# Patient Record
Sex: Male | Born: 1990
Health system: Southern US, Community
[De-identification: ages and names within clinical notes are randomized; demographics above are authoritative.]

---

## 2018-09-19 ENCOUNTER — Encounter: Payer: Self-pay | Admitting: Family Medicine

## 2018-09-19 ENCOUNTER — Ambulatory Visit: Payer: BLUE CROSS/BLUE SHIELD | Admitting: Family Medicine

## 2018-09-19 VITALS — BP 92/60 | HR 72 | Temp 97.9°F | Ht 69.5 in | Wt 153.4 lb

## 2018-09-19 DIAGNOSIS — M25511 Pain in right shoulder: Secondary | ICD-10-CM

## 2018-09-19 DIAGNOSIS — J301 Allergic rhinitis due to pollen: Secondary | ICD-10-CM

## 2018-09-19 DIAGNOSIS — Z0000 Encounter for general adult medical examination without abnormal findings: Secondary | ICD-10-CM | POA: Diagnosis not present

## 2018-09-19 DIAGNOSIS — Z114 Encounter for screening for human immunodeficiency virus [HIV]: Secondary | ICD-10-CM | POA: Diagnosis not present

## 2018-09-19 DIAGNOSIS — M25519 Pain in unspecified shoulder: Secondary | ICD-10-CM | POA: Insufficient documentation

## 2018-09-19 DIAGNOSIS — G8929 Other chronic pain: Secondary | ICD-10-CM

## 2018-09-19 DIAGNOSIS — J309 Allergic rhinitis, unspecified: Secondary | ICD-10-CM | POA: Insufficient documentation

## 2018-09-19 LAB — COMPREHENSIVE METABOLIC PANEL
ALT: 9 U/L (ref 0–53)
AST: 11 U/L (ref 0–37)
Albumin: 4.5 g/dL (ref 3.5–5.2)
Alkaline Phosphatase: 35 U/L — ABNORMAL LOW (ref 39–117)
BUN: 18 mg/dL (ref 6–23)
CHLORIDE: 105 meq/L (ref 96–112)
CO2: 30 mEq/L (ref 19–32)
Calcium: 9.2 mg/dL (ref 8.4–10.5)
Creatinine, Ser: 0.77 mg/dL (ref 0.40–1.50)
GFR: 126.29 mL/min (ref >60.00)
GLUCOSE: 94 mg/dL (ref 70–99)
POTASSIUM: 4.1 meq/L (ref 3.5–5.1)
SODIUM: 140 meq/L (ref 135–145)
Total Bilirubin: 0.4 mg/dL (ref 0.2–1.2)
Total Protein: 6.8 g/dL (ref 6.0–8.3)

## 2018-09-19 LAB — CBC
HEMATOCRIT: 40.4 % (ref 39.0–52.0)
HEMOGLOBIN: 13.1 g/dL (ref 13.0–17.0)
MCHC: 32.3 g/dL (ref 30.0–36.0)
MCV: 81.7 fl (ref 78.0–100.0)
Platelets: 219 10*3/uL (ref 150.0–400.0)
RBC: 4.95 Mil/uL (ref 4.22–5.81)
RDW: 13.9 % (ref 11.5–15.5)
WBC: 5.7 10*3/uL (ref 4.0–10.5)

## 2018-09-19 LAB — TSH: TSH: 2.97 u[IU]/mL (ref 0.35–4.50)

## 2018-09-19 MED ORDER — MELOXICAM 7.5 MG PO TABS: 7.5000 mg | ORAL_TABLET | Freq: Every day | ORAL | 1 refills | Status: AC

## 2018-09-19 NOTE — Progress Notes (Signed)
James Bruce - 27 y.o. male MRN [id number]  Date of birth: 07-11-1991  Subjective Chief Complaint  Patient presents with  . Annual Exam    HPI James Bruce is a 27 y.o. male here today to establish care and for annual exam.  He has a couple of additional concerns as well.  He follows a fairly health diet with regular exercise.  Unfortunately he does smoke about 1ppd.  He started smoking when he was about 27 years old.  Has tried to quit in the past but was unsuccessful.  Has never tried any aids or medications to help with smoking cessation.  He feels he is not ready to try to quit at this time.   -Shoulder pain:  R shoulder pain off and on for the past few months.  Denies any known injury.  Pain worse with pushing/pulling movement.  Some mild pain in mid to lower back as well.  Denies numbness, tingling, weakess.  Has not tried anything for treatment.   -Congestion:  Congestion that occurs typically around the fall months.  He has clear nasal discharge w/ sneezing.  Denies sinus pain/pressure, fever, chills, ear pain.   Review of Systems  Constitutional: Negative for chills, fever, malaise/fatigue and weight loss.  HENT: Positive for congestion. Negative for ear pain and sore throat.   Eyes: Negative for blurred vision, double vision and pain.  Respiratory: Negative for cough and shortness of breath.   Cardiovascular: Negative for chest pain and palpitations.  Gastrointestinal: Negative for abdominal pain, blood in stool, constipation, heartburn and nausea.  Genitourinary: Negative for dysuria and urgency.  Musculoskeletal: Positive for joint pain. Negative for myalgias.  Neurological: Negative for dizziness and headaches.  Endo/Heme/Allergies: Does not bruise/bleed easily.  Psychiatric/Behavioral: Negative for depression. The patient is not nervous/anxious and does not have insomnia.      No Known Allergies  History reviewed. No pertinent past medical history.  History reviewed. No  pertinent surgical history.  Social History   Socioeconomic History  . Marital status: Single    Spouse name: Not on file  . Number of children: Not on file  . Years of education: Not on file  . Highest education level: Not on file  Occupational History  . Not on file  Social Needs  . Financial resource strain: Not on file  . Food insecurity:    Worry: Not on file    Inability: Not on file  . Transportation needs:    Medical: Not on file    Non-medical: Not on file  Tobacco Use  . Smoking status: Current Every Day Smoker  . Smokeless tobacco: Never Used  Substance and Sexual Activity  . Alcohol use: Yes    Comment: rarely   . Drug use: Never  . Sexual activity: Not on file  Lifestyle  . Physical activity:    Days per week: Not on file    Minutes per session: Not on file  . Stress: Not on file  Relationships  . Social connections:    Talks on phone: Not on file    Gets together: Not on file    Attends religious service: Not on file    Active member of club or organization: Not on file    Attends meetings of clubs or organizations: Not on file    Relationship status: Not on file  Other Topics Concern  . Not on file  Social History Narrative  . Not on file    History reviewed. No pertinent family  history.  Health Maintenance  Topic Date Due  . HIV Screening  12/18/2003  . TETANUS/TDAP  12/18/2007  . INFLUENZA VACCINE  09/16/2019 (Originally 07/10/2018)    ----------------------------------------------------------------------------------------------------------------------------------------------------------------------------------------------------------------- Physical Exam BP 92/60   Pulse 72   Temp 97.9 F (36.6 C) (Oral)   Ht 5' 9.5" (1.765 m)   Wt 153 lb 5.8 oz (69.6 kg)   SpO2 99%   BMI 22.32 kg/m   Physical Exam  Constitutional: He is oriented to person, place, and time. He appears well-nourished. No distress.  HENT:  Head: Normocephalic and  atraumatic.  Right Ear: External ear normal.  Left Ear: External ear normal.  Mouth/Throat: Oropharynx is clear and moist.  Eyes: No scleral icterus.  Neck: Normal range of motion. No thyromegaly present.  Cardiovascular: Normal rate, regular rhythm, normal heart sounds and intact distal pulses.  Pulmonary/Chest: Effort normal and breath sounds normal.  Abdominal: Soft. Bowel sounds are normal. He exhibits no distension. There is no tenderness. There is no guarding.  Musculoskeletal: He exhibits no edema.  Lymphadenopathy:    He has no cervical adenopathy.  Neurological: He is alert and oriented to person, place, and time. No cranial nerve deficit. He exhibits normal muscle tone.  Skin: Skin is warm and dry. No rash noted.  Psychiatric: He has a normal mood and affect. His behavior is normal.    ------------------------------------------------------------------------------------------------------------------------------------------------------------------------------------------------------------------- Assessment and Plan  Shoulder pain -Intermittent pain, no abnormalities on exam today.   -Meloxicam as needed, he will let me know if not improving with this.   Allergic rhinitis -Recommend trial of cetirizine.   Well adult exam -Well adult Orders Placed This Encounter  Procedures  . Comp Met (CMET)  . CBC  . TSH  . HIV antibody (with reflex)  Immunizations:  Declines flu vaccine Screenings:  HIV screening Anticipatory guidance/risk factor reduction: Counseled on smoking cessation.  Additional recommendations per AVS.

## 2018-09-19 NOTE — Patient Instructions (Signed)
-Try cetirizine for nasal congestion -You can add on flonase if not improving with this.    Adult Wellness Guidelines   Adult Health - for Ages 31 and Over Preventive care is very important for adults. By making some good basic health choices, women and men can boost their own health and well-being. Some of these positive choices include:   Eat a healthy diet  Get regular exercise  Don't use tobacco products  Limit alcohol use  Strive for a healthy weight  Adult Recommendations Screenings Physical Exam Every year, or as directed by your doctor. Body Mass Index (BMI) Every year. Blood Pressure (BP) At least every two years. Colon Cancer Screening Beginning at age 71 - colonoscopy every 10 years, or flexible sigmoidoscopy every five years or fecal blood test annually. Diabetes Screening Those with high blood pressure or high cholesterol should be screened. Others, especially those who are overweight or have a close family history of diabetes, should consider being screened every three years. Vision Screening Every year.  Immunizations Tetanus, Diphtheria, Pertussis (Td/ Tdap) Get Tdap vaccine once, then a Td booster every 10 years. Influenza (Flu) Every year. Herpes Zoster (Shingles) One dose given at age 53 and over. Varicella (Chicken Pox) Two doses if no evidence of immunity. Pneumococcal (Pneumonia) One or two doses for adults age 68 and older, or one or two doses depending on indication. Measles, Mumps, Rubella (MMR) One or two doses for adults ages 90-55 if no evidence of immunity. Human Papillomavirus (HPV) Three doses for women ages 19-26 if not already given. Three doses for men ages 19-21 if not already given.* Hepatitis A Two or three doses for adults age 54 and over.** Hepatitis B Three doses for ages 54 and over.** * Recommendations may vary. Discuss the start and frequency of screenings with your doctor, especially if you are at increased risk. ** For select  populations. Discuss with your doctor if this vaccine is right for you.  Women's Health Women have their own unique health care needs. To stay well, they should make regular screenings a priority. Women should discuss the recommendations listed on the chart with their doctors. Women's Recommendations Mammogram Every year for women beginning at age 60.* Cholesterol Starting age and frequency of screenings are based on your individual risk factors. Talk with your doctor about what is best for you. Pap Test Women ages 21-65: Pap test every three years. Another option for ages 33-65: Pap test and HPV test every five years. Women who have had a hysterectomy or are over age 70 may not need a Pap test.* Osteoporosis Screening Beginning at age 26, or at age 38 if risk factors are present.* Aspirin Use At ages 55-79, talk with your doctor about the benefits and risks of aspirin use. Pelvic Exam Every year for ages 62 and over. Folic Acid Women planning/capable of pregnancy should take a daily supplement containing .4-.8 mg of folic acid for prevention of neural tube defects.  * Recommendations may vary. Discuss screening options with your doctor, especially if you are at increased risk. Sources: [place] Department of Health and Financial controller and the Centers for Disease Control and Prevention, U.S. Preventive Services Task Force   Men's Health Recommendations Men are encouraged to get care as needed and make smart choices. That includes following a healthy lifestyle and getting recommended preventive care services.  Recommended preventative care services are as follows:  Cholesterol Ages 20-35 should be tested if at high risk. Men age  35 and over should be tested. Prostate Cancer Screening Ages 34 and over, discuss the benefits and risks of screening with your doctor.* Abdominal Aortic Aneurysm Once between ages 67 and 62 if you have ever smoked.

## 2018-09-19 NOTE — Assessment & Plan Note (Signed)
-  Recommend trial of cetirizine.

## 2018-09-19 NOTE — Progress Notes (Addendum)
James Bruce - 27 y.o. male MRN [id number]  Date of birth: 1991-08-26  Subjective Chief Complaint  Patient presents with  . Annual Exam    HPI 27 y/o patient that presents today to establish care. He denies any significant past medical history. He reports getting dental care within the last 6 months but denies regularly receiving dental care. Additionally, patient denies regular eye exams but reports no changes in vision.   Patient presents with runny nose, sore throat, and cough. He denies fever, chills, or body aches. He reports these symptoms to appear around Fall each year with minimal relief from OTC cold and flu meds.   Additionally, patient reports intermittent pain to Right shoulder that began approximately 9 months ago. He cannot remember it occurring with trauma or doing movement. He reports waking up one morning with pain in his shoulder. He reports irritation with repetitive movement at work. He report heat to have minimal relief. He denies pain at this time.   No Known Allergies  History reviewed. No pertinent past medical history.  History reviewed. No pertinent surgical history.  Social History   Socioeconomic History  . Marital status: Single    Spouse name: Not on file  . Number of children: Not on file  . Years of education: Not on file  . Highest education level: Not on file  Occupational History  . Not on file  Social Needs  . Financial resource strain: Not on file  . Food insecurity:    Worry: Not on file    Inability: Not on file  . Transportation needs:    Medical: Not on file    Non-medical: Not on file  Tobacco Use  . Smoking status: Current Every Day Smoker  . Smokeless tobacco: Never Used  Substance and Sexual Activity  . Alcohol use: Yes    Comment: rarely   . Drug use: Never  . Sexual activity: Not on file  Lifestyle  . Physical activity:    Days per week: Not on file    Minutes per session: Not on file  . Stress: Not on file    Relationships  . Social connections:    Talks on phone: Not on file    Gets together: Not on file    Attends religious service: Not on file    Active member of club or organization: Not on file    Attends meetings of clubs or organizations: Not on file    Relationship status: Not on file  Other Topics Concern  . Not on file  Social History Narrative  . Not on file   Psychosocial History: -Occupation: Production designer, theatre/television/film at [employer]: Lives at home with brother and brother's wife -Drug Use: Patient denies use of IV/Street/recreational drug use.  -Alcohol Use: Patient drinks one alcoholic beverage once a month -Caffeine: 1 Cup of coffee per day -Diet: Patient reports healthy diet -Exercise: Patient denies regular exercise    History reviewed. No pertinent family history.  Health Maintenance  Topic Date Due  . HIV Screening  12/18/2003  . TETANUS/TDAP  12/18/2007  . INFLUENZA VACCINE  09/16/2019 (Originally 07/10/2018)   ROS General: Patient reports adequate energy level with a regular sleeping pattern. Pt denies weight loss or weight gain.  HEENT: Denies vision or hearing changes. Pt denies difficulty swallowing.  Cardiac:  Denies chest pain, pressure, or palpitations. Pt denies syncope or dizziness.  Pulmonary: Denies shortness of breath GI: Patient denies n/v/d. Reports regular bowel movements.  GU: Pt reports  clear yellow urine.  Musculoskeletal: see HPI   ----------------------------------------------------------------------------------------------------------------------------------------------------------------------------------------------------------------- Physical Exam BP 92/60   Pulse 72   Temp 97.9 F (36.6 C) (Oral)   Ht 5' 9.5" (1.765 m)   Wt 153 lb 5.8 oz (69.6 kg)   SpO2 99%   BMI 22.32 kg/m   Physical Exam  Constitutional: He is oriented to person, place, and time. He appears well-developed and well-nourished.  HENT:  Right Ear: Tympanic  membrane is retracted. Tympanic membrane is not erythematous and not bulging.  Left Ear: Tympanic membrane is retracted. Tympanic membrane is not erythematous and not bulging.  Mouth/Throat: Oropharynx is clear and moist. No oropharyngeal exudate.  Eyes: No scleral icterus.  Neck: No thyromegaly present.  Cardiovascular: Normal rate, regular rhythm and normal heart sounds.  Pulmonary/Chest: Effort normal and breath sounds normal. No respiratory distress.  Abdominal: Soft. Bowel sounds are normal. He exhibits no distension.  Musculoskeletal:  FROM to R shoulder with a mildly positive scarf sign. Adequate rotator cuff strength. No signs of impingement. Negative empty can test.   Lymphadenopathy:    He has no cervical adenopathy.  Neurological: He is alert and oriented to person, place, and time.  Skin: Skin is warm and dry. Capillary refill takes less than 2 seconds.  Psychiatric: He has a normal mood and affect.    ------------------------------------------------------------------------------------------------------------------------------------------------------------------------------------------------------------------- Assessment and Plan  Wellness exam -Continue healthy diet and increase physical activity -Recommend receiving dental care q 6 months and eye exams annually.  -Labs: TSH, CBC, Chem 7, and HIV screening.  -Refuses influenza vaccine at this time.   Seasonal allergies -Begin taking zyrtec OTC daily. Can add flonase if no improvement.   Tendonitis -Take meloxicam as needed for pain to right shoulder

## 2018-09-19 NOTE — Assessment & Plan Note (Signed)
-  Well adult Orders Placed This Encounter  Procedures  . Comp Met (CMET)  . CBC  . TSH  . HIV antibody (with reflex)  Immunizations:  Declines flu vaccine Screenings:  HIV screening Anticipatory guidance/risk factor reduction: Counseled on smoking cessation.  Additional recommendations per AVS.

## 2018-09-19 NOTE — Assessment & Plan Note (Signed)
-  Intermittent pain, no abnormalities on exam today.   -Meloxicam as needed, he will let me know if not improving with this.

## 2018-09-20 LAB — HIV ANTIBODY (ROUTINE TESTING W REFLEX): HIV: NONREACTIVE

## 2019-05-20 ENCOUNTER — Encounter (HOSPITAL_COMMUNITY): Payer: Self-pay

## 2019-05-20 ENCOUNTER — Ambulatory Visit: Payer: Self-pay | Admitting: Family Medicine

## 2019-05-20 ENCOUNTER — Emergency Department (HOSPITAL_COMMUNITY)
Admission: EM | Admit: 2019-05-20 | Discharge: 2019-05-21 | Disposition: A | Discharge status: Home / Self Care | Payer: BC Managed Care – PPO | Attending: Emergency Medicine | Admitting: Emergency Medicine

## 2019-05-20 ENCOUNTER — Emergency Department (HOSPITAL_COMMUNITY): Payer: BC Managed Care – PPO

## 2019-05-20 ENCOUNTER — Other Ambulatory Visit: Payer: Self-pay

## 2019-05-20 DIAGNOSIS — F17200 Nicotine dependence, unspecified, uncomplicated: Secondary | ICD-10-CM | POA: Diagnosis not present

## 2019-05-20 DIAGNOSIS — R0602 Shortness of breath: Secondary | ICD-10-CM | POA: Diagnosis not present

## 2019-05-20 DIAGNOSIS — R0600 Dyspnea, unspecified: Secondary | ICD-10-CM | POA: Diagnosis not present

## 2019-05-20 DIAGNOSIS — R079 Chest pain, unspecified: Secondary | ICD-10-CM | POA: Diagnosis not present

## 2019-05-20 DIAGNOSIS — E875 Hyperkalemia: Secondary | ICD-10-CM | POA: Diagnosis not present

## 2019-05-20 DIAGNOSIS — J069 Acute upper respiratory infection, unspecified: Secondary | ICD-10-CM | POA: Diagnosis not present

## 2019-05-20 DIAGNOSIS — J019 Acute sinusitis, unspecified: Secondary | ICD-10-CM | POA: Insufficient documentation

## 2019-05-20 DIAGNOSIS — R0789 Other chest pain: Secondary | ICD-10-CM | POA: Diagnosis not present

## 2019-05-20 LAB — BASIC METABOLIC PANEL WITH GFR
Anion gap: 9 (ref 5–15)
BUN: 16 mg/dL (ref 6–20)
CO2: 23 mmol/L (ref 22–32)
Calcium: 9.2 mg/dL (ref 8.9–10.3)
Chloride: 105 mmol/L (ref 98–111)
Creatinine, Ser: 0.74 mg/dL (ref 0.61–1.24)
GFR calc Af Amer: >60 mL/min
GFR calc non Af Amer: >60 mL/min
Glucose, Bld: 79 mg/dL (ref 70–99)
Potassium: 3.8 mmol/L (ref 3.5–5.1)
Sodium: 137 mmol/L (ref 135–145)

## 2019-05-20 LAB — CBC
HCT: 42.2 % (ref 39.0–52.0)
Hemoglobin: 13.6 g/dL (ref 13.0–17.0)
MCH: 27 pg (ref 26.0–34.0)
MCHC: 32.2 g/dL (ref 30.0–36.0)
MCV: 83.7 fL (ref 80.0–100.0)
Platelets: 220 10*3/uL (ref 150–400)
RBC: 5.04 MIL/uL (ref 4.22–5.81)
RDW: 14.1 % (ref 11.5–15.5)
WBC: 6.1 10*3/uL (ref 4.0–10.5)
nRBC: 0 % (ref 0.0–0.2)

## 2019-05-20 LAB — TROPONIN I: Troponin I: <0.03 ng/mL

## 2019-05-20 MED ORDER — SODIUM CHLORIDE 0.9% FLUSH: 3.0000 mL | Freq: Once | INTRAVENOUS | Status: DC

## 2019-05-20 MED ORDER — DOXYCYCLINE HYCLATE 100 MG PO CAPS: 100.0000 mg | ORAL_CAPSULE | Freq: Two times a day (BID) | ORAL | 0 refills | Status: AC

## 2019-05-20 NOTE — Telephone Encounter (Signed)
Pt. Reports he started having shortness of breat today. "I can't get a good breath." Also has nasal congestion. Denies fever. States he has felt like this all day, sudden onset. Does have chest tightness. Instructed to go to UC today due to no availability in the office. Verbalizes understanding.  Reason for Disposition . [1] MILD difficulty breathing (e.g., minimal/no SOB at rest, SOB with walking, pulse <100) AND [2] NEW-onset or WORSE than normal  Answer Assessment - Initial Assessment Questions 1. RESPIRATORY STATUS: "Describe your breathing?" (e.g., wheezing, shortness of breath, unable to speak, severe coughing)      Shortness of breath with exertion 2. ONSET: "When did this breathing problem begin?"      Today 3. PATTERN "Does the difficult breathing come and go, or has it been constant since it started?"      Constant at work 4. SEVERITY: "How bad is your breathing?" (e.g., mild, moderate, severe)    - MILD: No SOB at rest, mild SOB with walking, speaks normally in sentences, can lay down, no retractions, pulse < 100.    - MODERATE: SOB at rest, SOB with minimal exertion and prefers to sit, cannot lie down flat, speaks in phrases, mild retractions, audible wheezing, pulse 100-120.    - SEVERE: Very SOB at rest, speaks in single words, struggling to breathe, sitting hunched forward, retractions, pulse > 120      Mild 5. RECURRENT SYMPTOM: "Have you had difficulty breathing before?" If so, ask: "When was the last time?" and "What happened that time?"      No 6. CARDIAC HISTORY: "Do you have any history of heart disease?" (e.g., heart attack, angina, bypass surgery, angioplasty)      No 7. LUNG HISTORY: "Do you have any history of lung disease?"  (e.g., pulmonary embolus, asthma, emphysema)     No 8. CAUSE: "What do you think is causing the breathing problem?"      Unsure 9. OTHER SYMPTOMS: "Do you have any other symptoms? (e.g., dizziness, runny nose, cough, chest pain, fever)  Chest feel a little tight 10. PREGNANCY: "Is there any chance you are pregnant?" "When was your last menstrual period?"       n/a 11. TRAVEL: "Have you traveled out of the country in the last month?" (e.g., travel history, exposures)       No  Protocols used: BREATHING DIFFICULTY-A-AH

## 2019-05-20 NOTE — Discharge Instructions (Addendum)
Doxycycline as prescribed.  Over-the-counter decongestants as needed.  Return to the ED if symptoms significantly worsen or change.

## 2019-05-20 NOTE — Telephone Encounter (Signed)
FYI

## 2019-05-20 NOTE — ED Triage Notes (Signed)
Pt sent here from UC for further evaluation of intermittent chest tightness, cough and SOB. Pt a.o, nad noted

## 2019-05-20 NOTE — ED Provider Notes (Signed)
[facility] Note   CSN: [id number] Arrival date & time: 05/20/19  1746    History   Chief Complaint Chief Complaint  Patient presents with  . Chest Pain  . Shortness of Breath    HPI Devoria Albendrew S Janowicz is a 28 y.o. male.     Patient is a 28 year old male presenting with complaints of difficulty breathing.  This has been occurring for the past four days.  He reports nasal congestion.  No chest pain, fever, or cough.  He went to urgent care, then was sent here due to concerns of his potassium level as the physician there thought his T-waves were peaked.    The history is provided by the patient.  Shortness of Breath  Severity:  Moderate Onset quality:  Gradual Duration:  4 days Timing:  Constant Progression:  Worsening Chronicity:  New Relieved by:  Nothing Worsened by:  Nothing Ineffective treatments:  None tried Associated symptoms: no cough     History reviewed. No pertinent past medical history.  Patient Active Problem List   Diagnosis Date Noted  . Well adult exam 09/19/2018  . Shoulder pain 09/19/2018  . Allergic rhinitis 09/19/2018    History reviewed. No pertinent surgical history.      Home Medications    Prior to Admission medications   Medication Sig Start Date End Date Taking? Authorizing Provider  meloxicam (MOBIC) 7.5 MG tablet Take 1-2 tablets (7.5-15 mg total) by mouth daily. 09/19/18   Everrett CoombeMatthews, [place], DO    Family History No family history on file.  Social History Social History   Tobacco Use  . Smoking status: Current Every Day Smoker  . Smokeless tobacco: Never Used  Substance Use Topics  . Alcohol use: Yes    Comment: rarely   . Drug use: Never     Allergies   Patient has no known allergies.   Review of Systems Review of Systems  Respiratory: Positive for shortness of breath. Negative for cough.   All other systems reviewed and are negative.    Physical Exam Updated Vital  Signs BP 124/69 (BP Location: Right Arm)   Pulse 68   Temp 98.3 F (36.8 C) (Oral)   Resp 18   SpO2 100%   Physical Exam Vitals signs and nursing note reviewed.  Constitutional:      General: He is not in acute distress.    Appearance: He is well-developed. He is not diaphoretic.  HENT:     Head: Normocephalic and atraumatic.  Neck:     Musculoskeletal: Normal range of motion and neck supple.  Cardiovascular:     Rate and Rhythm: Normal rate and regular rhythm.     Heart sounds: No murmur. No friction rub.  Pulmonary:     Effort: Pulmonary effort is normal. No respiratory distress.     Breath sounds: Normal breath sounds. No wheezing or rales.  Abdominal:     General: Bowel sounds are normal. There is no distension.     Palpations: Abdomen is soft.     Tenderness: There is no abdominal tenderness.  Musculoskeletal: Normal range of motion.  Lymphadenopathy:     Cervical: No cervical adenopathy.  Skin:    General: Skin is warm and dry.  Neurological:     Mental Status: He is alert and oriented to person, place, and time.     Coordination: Coordination normal.      ED Treatments / Results  Labs (all labs ordered  are listed, but only abnormal results are displayed) Labs Reviewed  BASIC METABOLIC PANEL  CBC  TROPONIN I    EKG EKG Interpretation  Date/Time:  Wednesday May 20 2019 17:56:53 EDT Ventricular Rate:  87 PR Interval:  140 QRS Duration: 94 QT Interval:  350 QTC Calculation: 421 R Axis:   79 Text Interpretation:  Normal sinus rhythm Minimal voltage criteria for LVH, may be normal variant Borderline ECG Confirmed by Veryl Speak [phone]) on 05/20/2019 11:21:20 PM   Radiology Dg Chest 2 View  Result Date: 05/20/2019 CLINICAL DATA:  Intermittent chest tightness. EXAM: CHEST - 2 VIEW COMPARISON:  None. FINDINGS: The heart size and mediastinal contours are within normal limits. Both lungs are clear. The visualized skeletal structures are unremarkable.  IMPRESSION: No active cardiopulmonary disease. Electronically Signed   By: Dorise Bullion III M.D   On: 05/20/2019 18:28    Procedures Procedures (including critical care time)  Medications Ordered in ED Medications  sodium chloride flush (NS) 0.9 % injection 3 mL (has no administration in time range)     Initial Impression / Assessment and Plan / ED Course  I have reviewed the triage vital signs and the nursing notes.  Pertinent labs & imaging results that were available during my care of the patient were reviewed by me and considered in my medical decision making (see chart for details).  Patient with complaints as described in the HPI.  I suspect sinus issues as this is what he is describing.  EKG is normal, troponin negative, and potassium level normal.  He will discharged with doxycycline for presumed sinusitis and decongestants.  To return prn.   Final Clinical Impressions(s) / ED Diagnoses   Final diagnoses:  None    ED Discharge Orders    None       Veryl Speak, MD 05/20/19 2357

## 2019-05-20 NOTE — ED Notes (Signed)
Patient called for vitals reassessment, no answer.  

## 2020-02-13 IMAGING — CR CHEST - 2 VIEW
2 series · 2 of 2 positions shown · non-contrast
Comparison: None.

CLINICAL DATA: Intermittent chest tightness.

EXAM:
CHEST - 2 VIEW

[chest pa]
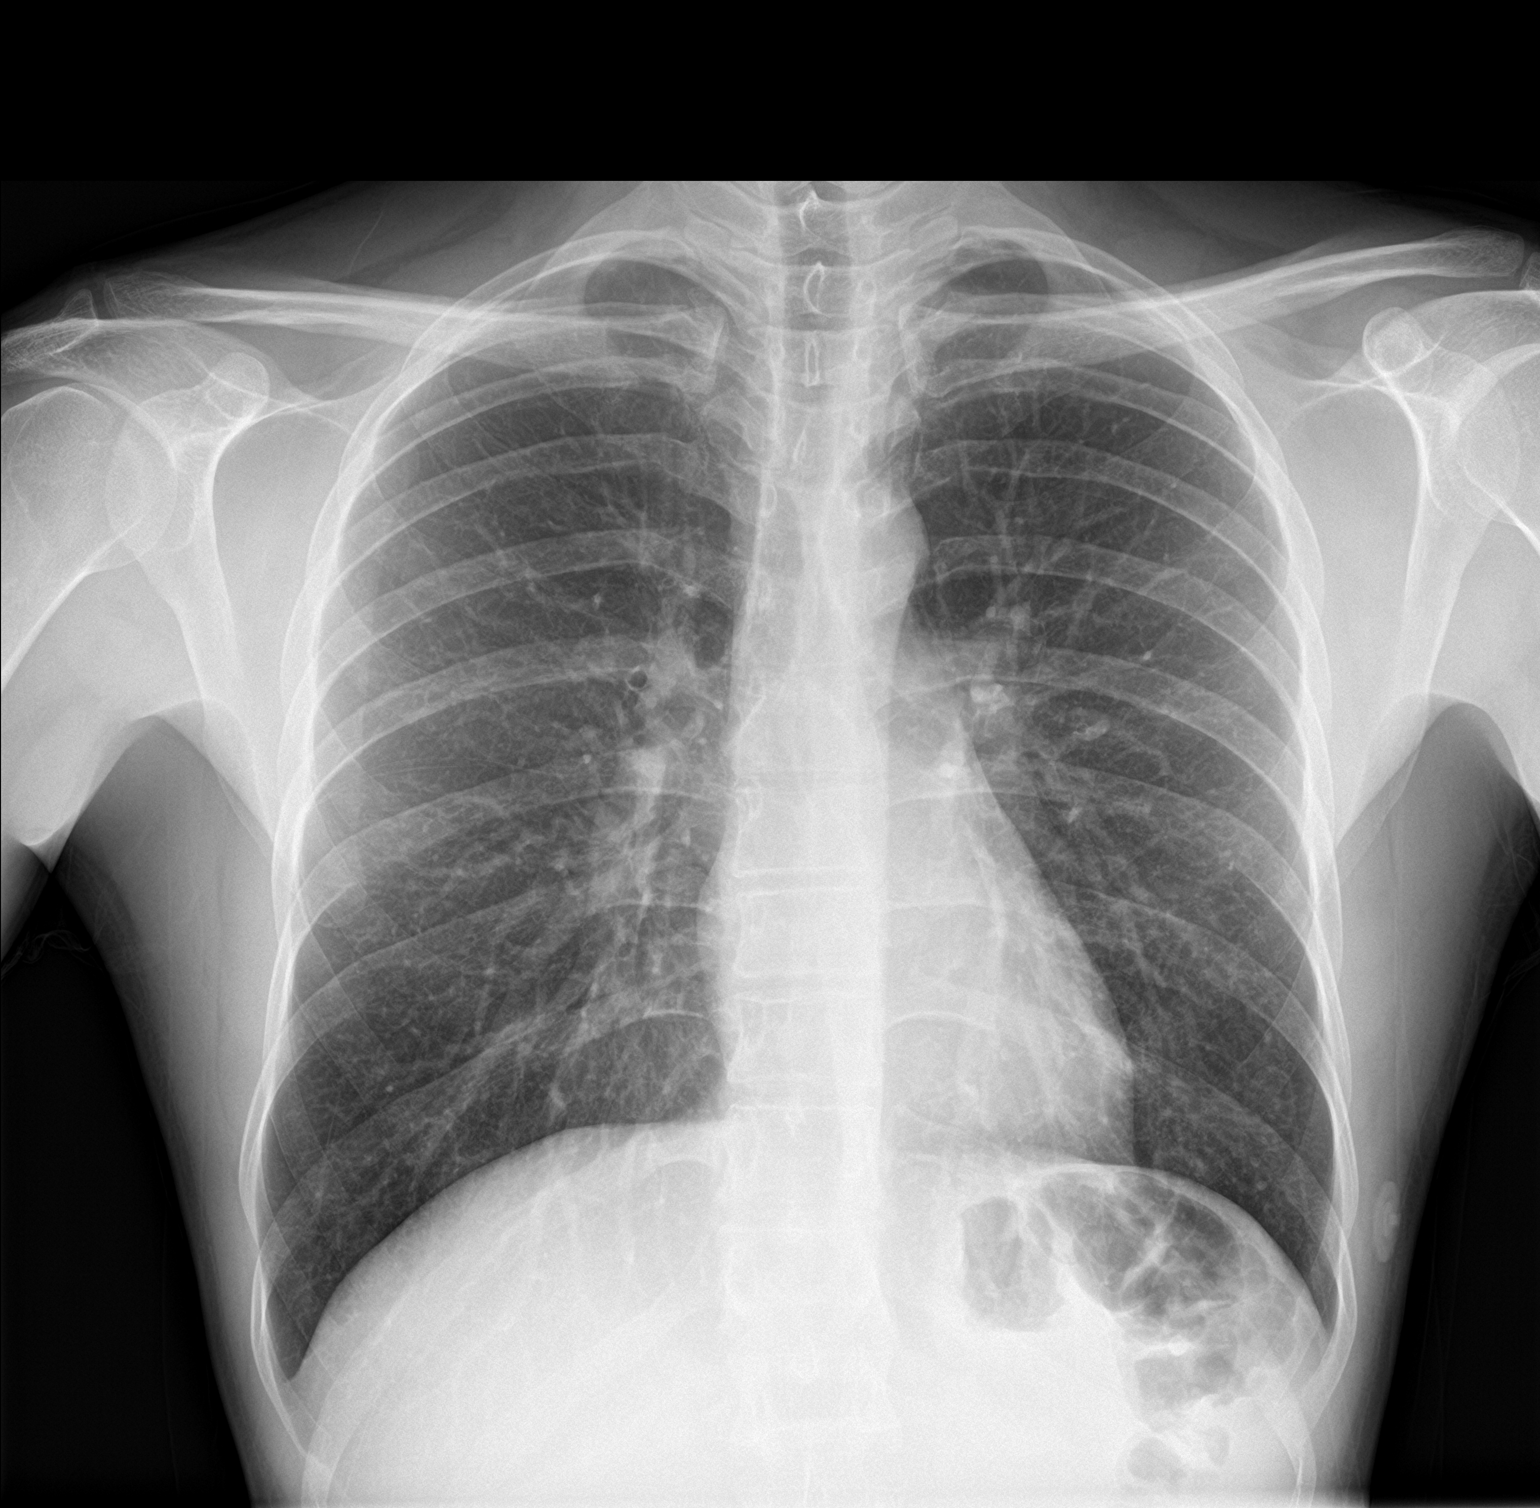

[chest lat]
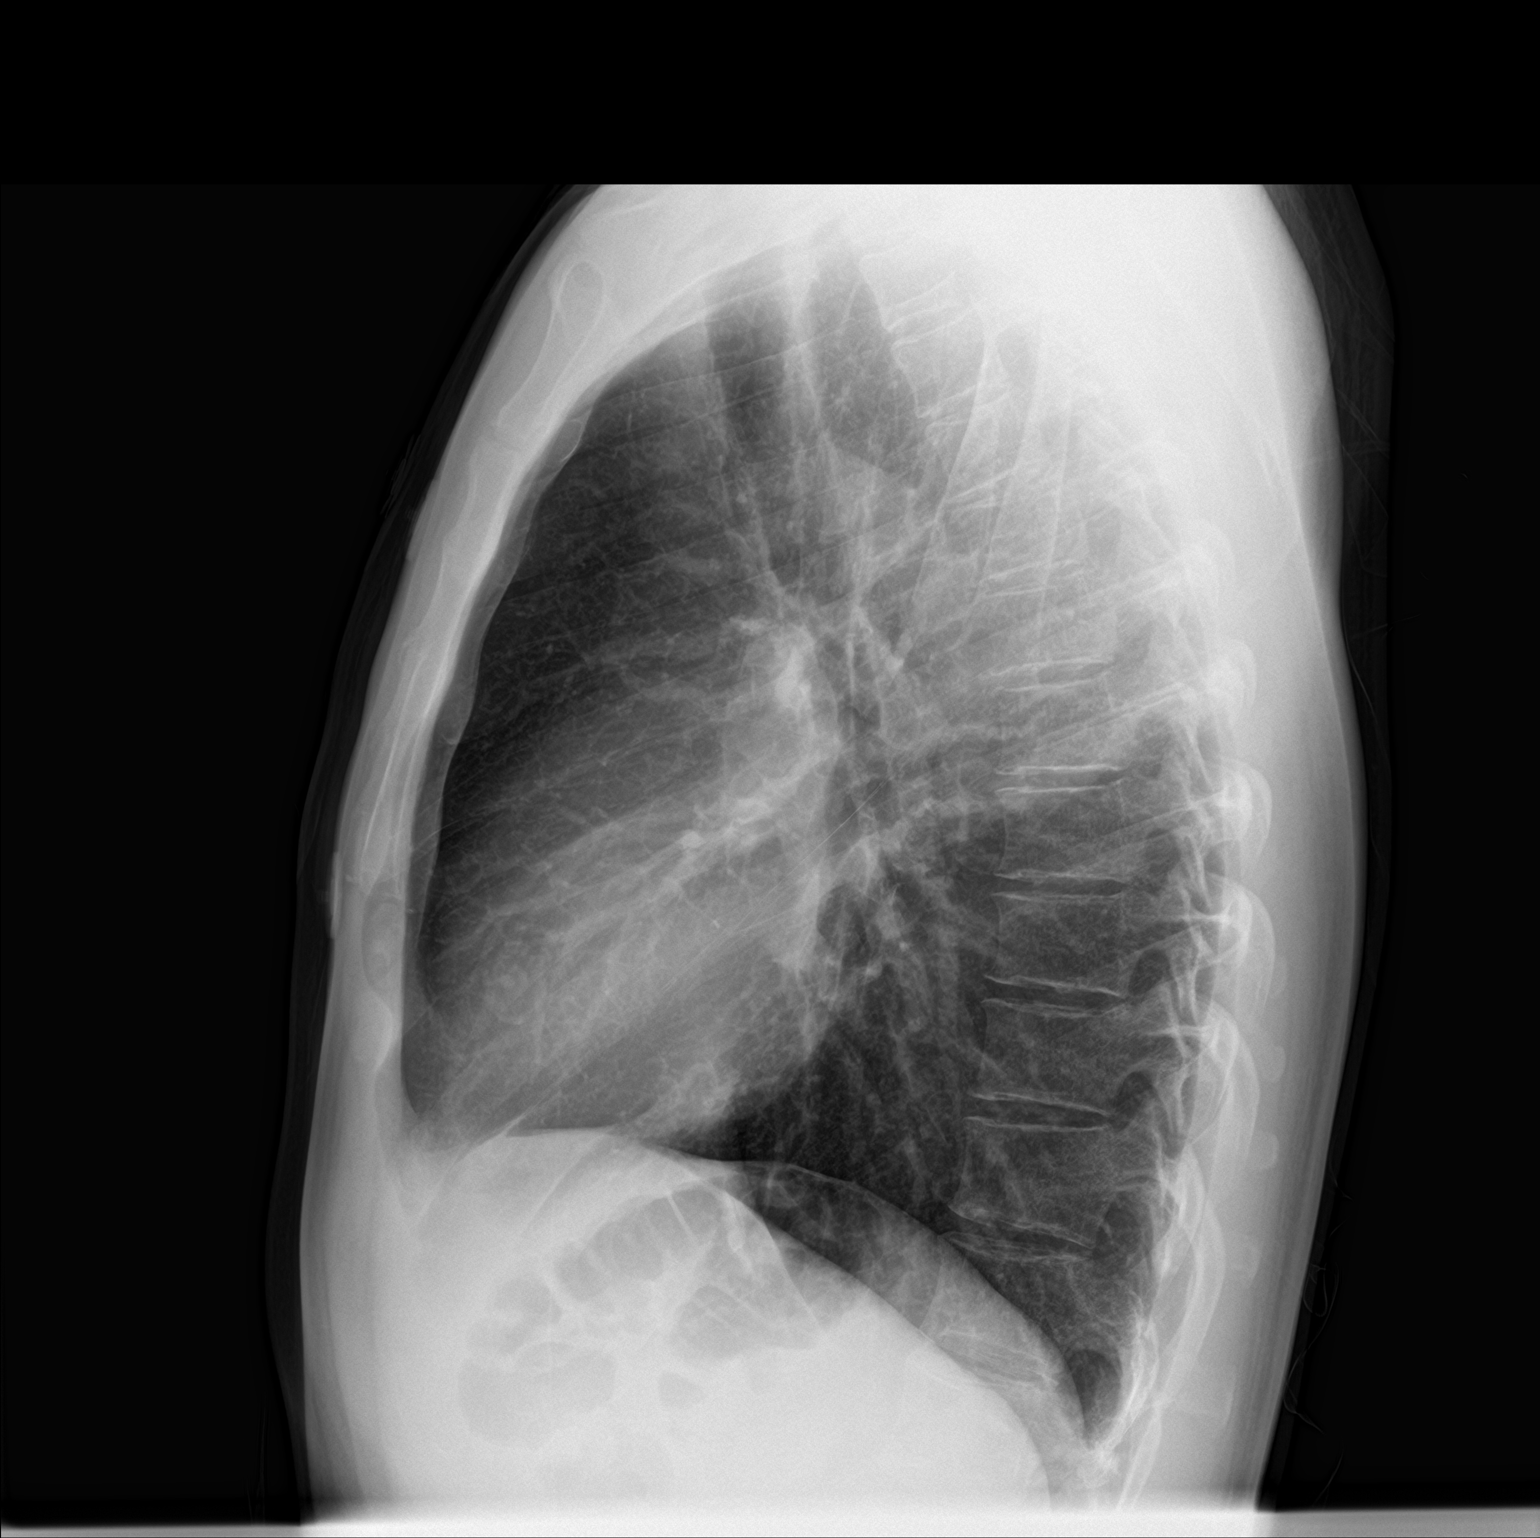

[2 of 2 positions shown; findings below may reference images not displayed]

FINDINGS: The heart size and mediastinal contours are within normal limits.
Both lungs are clear. The visualized skeletal structures are
unremarkable.
IMPRESSION: No active cardiopulmonary disease.

## 2020-09-26 DIAGNOSIS — Z13220 Encounter for screening for lipoid disorders: Secondary | ICD-10-CM | POA: Diagnosis not present

## 2020-09-26 DIAGNOSIS — Z6822 Body mass index (BMI) 22.0-22.9, adult: Secondary | ICD-10-CM | POA: Diagnosis not present

## 2020-09-26 DIAGNOSIS — Z131 Encounter for screening for diabetes mellitus: Secondary | ICD-10-CM | POA: Diagnosis not present

## 2020-09-26 DIAGNOSIS — Z0000 Encounter for general adult medical examination without abnormal findings: Secondary | ICD-10-CM | POA: Diagnosis not present

## 2020-09-26 DIAGNOSIS — Z1331 Encounter for screening for depression: Secondary | ICD-10-CM | POA: Diagnosis not present

## 2020-12-22 DIAGNOSIS — J069 Acute upper respiratory infection, unspecified: Secondary | ICD-10-CM | POA: Diagnosis not present

## 2021-03-21 DIAGNOSIS — S335XXA Sprain of ligaments of lumbar spine, initial encounter: Secondary | ICD-10-CM | POA: Diagnosis not present

## 2021-09-14 DIAGNOSIS — R509 Fever, unspecified: Secondary | ICD-10-CM | POA: Diagnosis not present

## 2021-09-14 DIAGNOSIS — J029 Acute pharyngitis, unspecified: Secondary | ICD-10-CM | POA: Diagnosis not present

## 2021-09-14 DIAGNOSIS — J019 Acute sinusitis, unspecified: Secondary | ICD-10-CM | POA: Diagnosis not present

## 2022-02-19 DIAGNOSIS — K529 Noninfective gastroenteritis and colitis, unspecified: Secondary | ICD-10-CM | POA: Diagnosis not present

## 2022-02-19 DIAGNOSIS — R1110 Vomiting, unspecified: Secondary | ICD-10-CM | POA: Diagnosis not present

## 2022-02-19 DIAGNOSIS — K591 Functional diarrhea: Secondary | ICD-10-CM | POA: Diagnosis not present

## 2022-06-21 DIAGNOSIS — S62603A Fracture of unspecified phalanx of left middle finger, initial encounter for closed fracture: Secondary | ICD-10-CM | POA: Diagnosis not present

## 2022-08-12 DIAGNOSIS — M5442 Lumbago with sciatica, left side: Secondary | ICD-10-CM | POA: Diagnosis not present
